# Patient Record
Sex: Male | Born: 1985 | Race: Black or African American | Hispanic: No | Marital: Single | Smoking: Current every day smoker
Health system: Southern US, Community
[De-identification: ages and names within clinical notes are randomized; demographics above are authoritative.]

## PROBLEM LIST (undated history)

## (undated) DIAGNOSIS — R011 Cardiac murmur, unspecified: Secondary | ICD-10-CM

## (undated) HISTORY — PX: OTHER SURGICAL HISTORY: SHX169

---
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
## 2006-01-26 ENCOUNTER — Emergency Department (HOSPITAL_COMMUNITY): Admission: EM | Admit: 2006-01-26 | Discharge: 2006-01-26 | Payer: Self-pay | Admitting: Emergency Medicine

## 2006-11-07 ENCOUNTER — Emergency Department (HOSPITAL_COMMUNITY): Admission: EM | Admit: 2006-11-07 | Discharge: 2006-11-07 | Payer: Self-pay | Admitting: Emergency Medicine

## 2007-02-21 ENCOUNTER — Emergency Department (HOSPITAL_COMMUNITY): Admission: EM | Admit: 2007-02-21 | Discharge: 2007-02-21 | Payer: Self-pay | Admitting: Family Medicine

## 2007-12-23 ENCOUNTER — Inpatient Hospital Stay (HOSPITAL_COMMUNITY): Admission: EM | Admit: 2007-12-23 | Discharge: 2007-12-26 | Payer: Self-pay | Admitting: Emergency Medicine

## 2007-12-27 ENCOUNTER — Emergency Department (HOSPITAL_COMMUNITY): Admission: EM | Admit: 2007-12-27 | Discharge: 2007-12-27 | Payer: Self-pay | Admitting: Emergency Medicine

## 2008-12-03 ENCOUNTER — Emergency Department (HOSPITAL_COMMUNITY): Admission: EM | Admit: 2008-12-03 | Discharge: 2008-12-04 | Payer: Self-pay | Admitting: Emergency Medicine

## 2009-02-21 ENCOUNTER — Emergency Department (HOSPITAL_COMMUNITY): Admission: EM | Admit: 2009-02-21 | Discharge: 2009-02-21 | Payer: Self-pay | Admitting: Emergency Medicine

## 2009-05-31 ENCOUNTER — Emergency Department (HOSPITAL_COMMUNITY): Admission: EM | Admit: 2009-05-31 | Discharge: 2009-05-31 | Payer: Self-pay | Admitting: Emergency Medicine

## 2010-09-01 ENCOUNTER — Emergency Department (HOSPITAL_COMMUNITY): Admission: EM | Admit: 2010-09-01 | Discharge: 2010-03-21 | Payer: Self-pay | Admitting: Emergency Medicine

## 2010-12-11 LAB — CULTURE, BLOOD (ROUTINE X 2)
Culture: NO GROWTH
Culture: NO GROWTH

## 2010-12-11 LAB — CBC
Platelets: 194 10*3/uL (ref 150–400)
RDW: 14.3 % (ref 11.5–15.5)
WBC: 6.8 10*3/uL (ref 4.0–10.5)

## 2010-12-11 LAB — DIFFERENTIAL
Basophils Absolute: 0 10*3/uL (ref 0.0–0.1)
Eosinophils Relative: 3 % (ref 0–5)
Lymphocytes Relative: 40 % (ref 12–46)
Neutro Abs: 3.4 10*3/uL (ref 1.7–7.7)

## 2011-01-05 LAB — URINALYSIS, ROUTINE W REFLEX MICROSCOPIC
Glucose, UA: NEGATIVE mg/dL
Ketones, ur: NEGATIVE mg/dL
pH: 6 (ref 5.0–8.0)
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
## 2011-02-07 NOTE — Discharge Summary (Signed)
NAMESRIANSH, FARRA            ACCOUNT NO.:  [id number]   MEDICAL RECORD NO.:  [id number]          PATIENT TYPE:  INP   LOCATION:  5152                         FACILITY:  [facility]   PHYSICIAN:  Gabrielle Dare. [name], M.D.DATE OF BIRTH:  1986-02-24   DATE OF ADMISSION:  12/22/2007  DATE OF DISCHARGE:  12/26/2007                               DISCHARGE SUMMARY   ADDENDUM   Arrangements were being made for Mr. Warzecha to be discharged home,  however, the patient after speaking with another family member by phone  then stated that he would not be discharging because he could not get up  and walk.  Physical therapy was ordered for the patient, but he declined  to mobilize with the physical therapist stating that he had too much  pain.  I did go and see the patient at that point and he stated that his  pain was so severe that he was unable to mobilize.  I offered him  various pain medications to try to get up and mobilize, but he stated  that he would not be able to mobilize due to his pain.  I again  reindurated with him that we needed to treat his pain in order that he  become more mobile.  The patient then began cursing and using much  abusive language.  He stated that he did not want to take the pain  medication and that was all that we were doing was treating his pain.  I  explained to the patient that this was a portion of his treatment, but  the other portions included getting up and mobilizing with therapies and  eating a regular diet.  I did offer the patient other modalities  including an abdominal binder to help with this discomfort as well as  BenGay analgesic balm and he agreed to try this.  He also agreed to try  other pain medicine and did take some oxycodone.  He eventually agreed  to get up and mobilize.  He then requested that he be given crutches and  a wheelchair as well, and I agreed that he could have this equipment and  he was discharged to home.      [name],  P.A.      Gabrielle Dare [name], M.D.  Electronically Signed    SR/MEDQ  D:  12/26/2007  T:  12/27/2007  Job:  [id number]
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
## 2011-06-19 LAB — RAPID URINE DRUG SCREEN, HOSP PERFORMED
Amphetamines: NOT DETECTED
Barbiturates: NOT DETECTED
Benzodiazepines: NOT DETECTED
Cocaine: NOT DETECTED
Opiates: NOT DETECTED
Tetrahydrocannabinol: POSITIVE — AB

## 2011-06-19 LAB — COMPREHENSIVE METABOLIC PANEL
ALT: 45
AST: 56 — ABNORMAL HIGH
Albumin: 4.1
Alkaline Phosphatase: 60
BUN: 10
CO2: 27
Calcium: 9.2
Chloride: 104
Creatinine, Ser: 1.09
GFR calc Af Amer: >60
GFR calc non Af Amer: >60
Glucose, Bld: 90
Potassium: 4.2
Sodium: 138
Total Bilirubin: 0.5
Total Protein: 7.2

## 2011-06-19 LAB — URINALYSIS, ROUTINE W REFLEX MICROSCOPIC
Bilirubin Urine: NEGATIVE
Leukocytes, UA: NEGATIVE
Nitrite: NEGATIVE
Specific Gravity, Urine: 1.04 — ABNORMAL HIGH
pH: 5.5

## 2011-06-19 LAB — CBC
HCT: 41.6
HCT: 43.8
Hemoglobin: 14.3
MCHC: 34
MCHC: 34.5
MCV: 85.9
Platelets: 215
Platelets: 218
RDW: 14
RDW: 14.2

## 2011-06-19 LAB — DIFFERENTIAL
Basophils Absolute: 0
Basophils Relative: 0
Eosinophils Absolute: 0
Eosinophils Relative: 0
Monocytes Absolute: 0.6

## 2011-06-19 LAB — URINE MICROSCOPIC-ADD ON

## 2011-06-19 LAB — ETHANOL: Alcohol, Ethyl (B): <5

## 2011-06-20 LAB — URINE MICROSCOPIC-ADD ON

## 2011-06-20 LAB — BASIC METABOLIC PANEL
BUN: 12
CO2: 23
Chloride: 102
Glucose, Bld: 90
Potassium: 6.2 — ABNORMAL HIGH
Sodium: 134 — ABNORMAL LOW

## 2011-06-20 LAB — DIFFERENTIAL
Basophils Absolute: 0
Basophils Relative: 0
Eosinophils Absolute: 0.1
Eosinophils Relative: 2
Lymphocytes Relative: 20
Monocytes Absolute: 0.3

## 2011-06-20 LAB — URINALYSIS, ROUTINE W REFLEX MICROSCOPIC
Bilirubin Urine: NEGATIVE
Nitrite: NEGATIVE
Protein, ur: NEGATIVE
Specific Gravity, Urine: 1.026
Urobilinogen, UA: 1

## 2011-06-20 LAB — POTASSIUM: Potassium: 5.8 — ABNORMAL HIGH

## 2011-06-20 LAB — CK: Total CK: 338 — ABNORMAL HIGH

## 2011-06-20 LAB — CBC
HCT: 47.2
Hemoglobin: 16.4
MCHC: 34.7
MCV: 83.3
Platelets: 239
RDW: 13.8

## 2013-10-15 ENCOUNTER — Encounter (HOSPITAL_COMMUNITY): Payer: Self-pay | Admitting: Emergency Medicine

## 2013-10-15 ENCOUNTER — Emergency Department (HOSPITAL_COMMUNITY)
Admission: EM | Admit: 2013-10-15 | Discharge: 2013-10-15 | Disposition: A | Discharge status: Home / Self Care | Payer: Self-pay | Attending: Emergency Medicine | Admitting: Emergency Medicine

## 2013-10-15 ENCOUNTER — Emergency Department (HOSPITAL_COMMUNITY): Payer: Self-pay

## 2013-10-15 DIAGNOSIS — S8990XA Unspecified injury of unspecified lower leg, initial encounter: Secondary | ICD-10-CM | POA: Insufficient documentation

## 2013-10-15 DIAGNOSIS — F17200 Nicotine dependence, unspecified, uncomplicated: Secondary | ICD-10-CM | POA: Insufficient documentation

## 2013-10-15 DIAGNOSIS — X500XXA Overexertion from strenuous movement or load, initial encounter: Secondary | ICD-10-CM | POA: Insufficient documentation

## 2013-10-15 DIAGNOSIS — Y929 Unspecified place or not applicable: Secondary | ICD-10-CM | POA: Insufficient documentation

## 2013-10-15 DIAGNOSIS — S99929A Unspecified injury of unspecified foot, initial encounter: Principal | ICD-10-CM

## 2013-10-15 DIAGNOSIS — S99919A Unspecified injury of unspecified ankle, initial encounter: Principal | ICD-10-CM

## 2013-10-15 DIAGNOSIS — Y9302 Activity, running: Secondary | ICD-10-CM | POA: Insufficient documentation

## 2013-10-15 MED ORDER — OXYCODONE-ACETAMINOPHEN 5-325 MG PO TABS
2.0000 | ORAL_TABLET | Freq: Once | ORAL | Status: AC
  Administered 2013-10-15: 2 via ORAL
  Filled 2013-10-15: qty 2

## 2013-10-15 MED ORDER — OXYCODONE-ACETAMINOPHEN 5-325 MG PO TABS: 1.0000 | ORAL_TABLET | ORAL | Status: AC | PRN

## 2013-10-15 NOTE — ED Notes (Signed)
Pt c/o continued pain since yesterday when he feels like his knee popped out of place

## 2013-10-15 NOTE — Discharge Instructions (Signed)
Use the immobilizer whenever you get up. Use ice on the sore area 3 times a day for 3 days. See the orthopedic doctor to be evaluated further and determine the exact source of your pain.    Knee Pain Knee pain can be a result of an injury or other medical conditions. Treatment will depend on the cause of your pain. HOME CARE  Only take medicine as told by your doctor.  Keep a healthy weight. Being overweight can make the knee hurt more.  Stretch before exercising or playing sports.  If there is constant knee pain, change the way you exercise. Ask your doctor for advice.  Make sure shoes fit well. Choose the right shoe for the sport or activity.  Protect your knees. Wear kneepads if needed.  Rest when you are tired. GET HELP RIGHT AWAY IF:   Your knee pain does not stop.  Your knee pain does not get better.  Your knee joint feels hot to the touch.  You have a fever. MAKE SURE YOU:   Understand these instructions.  Will watch this condition.  Will get help right away if you are not doing well or get worse. Document Released: 12/08/2008 Document Revised: 12/04/2011 Document Reviewed: 12/08/2008 [facility] Patient Information 2014 [place], [place]. Knee Immobilizer A knee immobilizer is used to support and protect an injured or painful knee. Knee immobilizers keep your knee from being used while it is healing. Some of the common immobilizers used include splints (air, plaster, fiberglass, stiff cloth, or aluminum) or casts. Wear your knee immobilizer as instructed and only remove it as instructed. HOME CARE INSTRUCTIONS   Use absorbent powder (such as baby powder or talcum powder) to control irritation from sweat and friction.  Adjust the immobilizer to be firm but not tight. Signs of an immobilizer that is too tight include:  Swelling.  Numbness.  Color change in your foot or ankle.  Increased pain.  While resting, raise your leg above the level of your heart.  Pillows can be used for support. This reduces throbbing and helps healing.  Remove the immobilizer to bathe and sleep. SEEK MEDICAL CARE IF:   You have increasing pain or swelling in the knee, foot, or ankle.  You have problems caused by the knee immobilizer or it breaks or needs replacement. MAKE SURE YOU:   Understand these instructions.  Will watch your condition.  Will get help right away if you are not doing well or get worse. Document Released: 09/11/2005 Document Revised: 07/02/2013 Document Reviewed: 05/05/2013 [facility]ExitCare Patient Information 2014 [place], Maryland[place].

## 2013-10-15 NOTE — ED Notes (Signed)
Pt returned from xray requesting additional pain meds

## 2013-10-15 NOTE — ED Notes (Signed)
Radiology called regarding delay in xray; Raiford NobleRick states two view was cancelled and four view not done yet at this time; Amy in Radiology states pt will be next--states was busy doing other patients in ER

## 2013-10-15 NOTE — ED Notes (Signed)
Pt states popped knee out of place yesterday and popped back in place; then ran and developed pain all the way to toes; previous history of dislocation
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
## 2014-09-18 ENCOUNTER — Emergency Department (HOSPITAL_COMMUNITY)
Admission: EM | Admit: 2014-09-18 | Discharge: 2014-09-18 | Disposition: A | Discharge status: Home / Self Care | Payer: Self-pay | Attending: Emergency Medicine | Admitting: Emergency Medicine

## 2014-09-18 ENCOUNTER — Emergency Department (HOSPITAL_COMMUNITY): Payer: Self-pay

## 2014-09-18 ENCOUNTER — Encounter (HOSPITAL_COMMUNITY): Payer: Self-pay | Admitting: Emergency Medicine

## 2014-09-18 DIAGNOSIS — Y998 Other external cause status: Secondary | ICD-10-CM | POA: Insufficient documentation

## 2014-09-18 DIAGNOSIS — Y9289 Other specified places as the place of occurrence of the external cause: Secondary | ICD-10-CM | POA: Insufficient documentation

## 2014-09-18 DIAGNOSIS — R079 Chest pain, unspecified: Secondary | ICD-10-CM

## 2014-09-18 DIAGNOSIS — Z720 Tobacco use: Secondary | ICD-10-CM | POA: Insufficient documentation

## 2014-09-18 DIAGNOSIS — W19XXXA Unspecified fall, initial encounter: Secondary | ICD-10-CM

## 2014-09-18 DIAGNOSIS — W1839XA Other fall on same level, initial encounter: Secondary | ICD-10-CM | POA: Insufficient documentation

## 2014-09-18 DIAGNOSIS — R011 Cardiac murmur, unspecified: Secondary | ICD-10-CM | POA: Insufficient documentation

## 2014-09-18 DIAGNOSIS — M542 Cervicalgia: Secondary | ICD-10-CM

## 2014-09-18 DIAGNOSIS — R112 Nausea with vomiting, unspecified: Secondary | ICD-10-CM | POA: Insufficient documentation

## 2014-09-18 DIAGNOSIS — Y9389 Activity, other specified: Secondary | ICD-10-CM | POA: Insufficient documentation

## 2014-09-18 DIAGNOSIS — S299XXA Unspecified injury of thorax, initial encounter: Secondary | ICD-10-CM | POA: Insufficient documentation

## 2014-09-18 DIAGNOSIS — S199XXA Unspecified injury of neck, initial encounter: Secondary | ICD-10-CM | POA: Insufficient documentation

## 2014-09-18 DIAGNOSIS — R7989 Other specified abnormal findings of blood chemistry: Secondary | ICD-10-CM | POA: Insufficient documentation

## 2014-09-18 HISTORY — DX: Cardiac murmur, unspecified: R01.1

## 2014-09-18 LAB — CBC
HCT: 43.9 % (ref 39.0–52.0)
Hemoglobin: 15 g/dL (ref 13.0–17.0)
MCH: 28.7 pg (ref 26.0–34.0)
MCHC: 34.2 g/dL (ref 30.0–36.0)
MCV: 84.1 fL (ref 78.0–100.0)
Platelets: 195 10*3/uL (ref 150–400)
RBC: 5.22 MIL/uL (ref 4.22–5.81)
RDW: 13.4 % (ref 11.5–15.5)
WBC: 6.5 10*3/uL (ref 4.0–10.5)

## 2014-09-18 LAB — BASIC METABOLIC PANEL
Anion gap: 16 — ABNORMAL HIGH (ref 5–15)
BUN: 15 mg/dL (ref 6–23)
CO2: 21 mmol/L (ref 19–32)
Calcium: 9.6 mg/dL (ref 8.4–10.5)
Chloride: 100 mEq/L (ref 96–112)
Creatinine, Ser: 1.61 mg/dL — ABNORMAL HIGH (ref 0.50–1.35)
GFR calc Af Amer: 66 mL/min — ABNORMAL LOW (ref >90)
GFR calc non Af Amer: 57 mL/min — ABNORMAL LOW (ref >90)
Glucose, Bld: 100 mg/dL — ABNORMAL HIGH (ref 70–99)
Potassium: 4.5 mmol/L (ref 3.5–5.1)
Sodium: 137 mmol/L (ref 135–145)

## 2014-09-18 LAB — I-STAT TROPONIN, ED: Troponin i, poc: 0.01 ng/mL (ref 0.00–0.08)

## 2014-09-18 NOTE — ED Notes (Signed)
Portable at bedside 
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
## 2014-09-18 NOTE — ED Notes (Signed)
PD advising pt not allowed any visitors.  Nurse first aware.

## 2014-09-18 NOTE — Discharge Instructions (Signed)
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
